# Patient Record
Sex: Female | Born: 1980 | Race: Black or African American | Hispanic: No | Marital: Married | Smoking: Never smoker
Health system: Southern US, Community
[De-identification: ages and names within clinical notes are randomized; demographics above are authoritative.]

## PROBLEM LIST (undated history)

## (undated) DIAGNOSIS — I10 Essential (primary) hypertension: Secondary | ICD-10-CM

---

## 2008-03-10 ENCOUNTER — Emergency Department (HOSPITAL_COMMUNITY): Admission: EM | Admit: 2008-03-10 | Discharge: 2008-03-10 | Payer: Self-pay | Admitting: Family Medicine

## 2008-09-07 ENCOUNTER — Emergency Department (HOSPITAL_COMMUNITY): Admission: EM | Admit: 2008-09-07 | Discharge: 2008-09-07 | Payer: Self-pay | Admitting: Family Medicine

## 2010-01-14 ENCOUNTER — Emergency Department (HOSPITAL_COMMUNITY): Admission: EM | Admit: 2010-01-14 | Discharge: 2010-01-14 | Payer: Self-pay | Admitting: Emergency Medicine

## 2010-01-17 ENCOUNTER — Ambulatory Visit (HOSPITAL_COMMUNITY): Admission: RE | Admit: 2010-01-17 | Discharge: 2010-01-17 | Payer: Self-pay | Admitting: Obstetrics and Gynecology

## 2011-03-01 LAB — DIFFERENTIAL
Basophils Absolute: 0 10*3/uL (ref 0.0–0.1)
Basophils Relative: 0 % (ref 0–1)
Eosinophils Absolute: 0 10*3/uL (ref 0.0–0.7)
Eosinophils Relative: 0 % (ref 0–5)
Lymphs Abs: 1.9 10*3/uL (ref 0.7–4.0)
Neutro Abs: 10.7 10*3/uL — ABNORMAL HIGH (ref 1.7–7.7)
Neutrophils Relative %: 81 % — ABNORMAL HIGH (ref 43–77)

## 2011-03-01 LAB — BASIC METABOLIC PANEL
BUN: 13 mg/dL (ref 6–23)
Calcium: 8.7 mg/dL (ref 8.4–10.5)
Chloride: 107 mEq/L (ref 96–112)
Creatinine, Ser: 0.71 mg/dL (ref 0.4–1.2)
Glucose, Bld: 118 mg/dL — ABNORMAL HIGH (ref 70–99)
Potassium: 4.1 mEq/L (ref 3.5–5.1)
Sodium: 137 mEq/L (ref 135–145)

## 2011-03-01 LAB — CBC
HCT: 30 % — ABNORMAL LOW (ref 36.0–46.0)
Hemoglobin: 9.6 g/dL — ABNORMAL LOW (ref 12.0–15.0)
Hemoglobin: 7.8 g/dL — ABNORMAL LOW (ref 12.0–15.0)
MCHC: 31.9 g/dL (ref 30.0–36.0)
MCV: 80 fL (ref 78.0–100.0)
Platelets: 275 10*3/uL (ref 150–400)
RBC: 3.75 MIL/uL — ABNORMAL LOW (ref 3.87–5.11)
RBC: 3.03 MIL/uL — ABNORMAL LOW (ref 3.87–5.11)
RDW: 17.9 % — ABNORMAL HIGH (ref 11.5–15.5)
WBC: 13.3 10*3/uL — ABNORMAL HIGH (ref 4.0–10.5)
WBC: 13.3 10*3/uL — ABNORMAL HIGH (ref 4.0–10.5)

## 2011-03-01 LAB — COMPREHENSIVE METABOLIC PANEL
ALT: 18 U/L (ref 0–35)
AST: 22 U/L (ref 0–37)
Albumin: 3.4 g/dL — ABNORMAL LOW (ref 3.5–5.2)
Alkaline Phosphatase: 63 U/L (ref 39–117)
BUN: 11 mg/dL (ref 6–23)
Creatinine, Ser: 0.62 mg/dL (ref 0.4–1.2)
GFR calc Af Amer: >60 mL/min (ref >60)
GFR calc non Af Amer: >60 mL/min (ref >60)
Sodium: 138 mEq/L (ref 135–145)
Total Bilirubin: 0.6 mg/dL (ref 0.3–1.2)
Total Protein: 6.9 g/dL (ref 6.0–8.3)

## 2011-03-01 LAB — URINALYSIS, ROUTINE W REFLEX MICROSCOPIC
Glucose, UA: NEGATIVE mg/dL
Ketones, ur: NEGATIVE mg/dL
Nitrite: NEGATIVE
Urobilinogen, UA: 0.2 mg/dL (ref 0.0–1.0)

## 2011-03-01 LAB — HCG, SERUM, QUALITATIVE: Preg, Serum: NEGATIVE

## 2011-09-04 LAB — POCT URINALYSIS DIP (DEVICE)
Ketones, ur: NEGATIVE
Nitrite: NEGATIVE
Urobilinogen, UA: 0.2

## 2011-09-04 LAB — POCT PREGNANCY, URINE: Preg Test, Ur: NEGATIVE

## 2011-09-10 LAB — POCT URINALYSIS DIP (DEVICE)
Glucose, UA: NEGATIVE
Operator id: 29721
Urobilinogen, UA: 1

## 2013-11-26 ENCOUNTER — Other Ambulatory Visit: Payer: Self-pay | Admitting: Nurse Practitioner

## 2013-11-26 ENCOUNTER — Other Ambulatory Visit (HOSPITAL_COMMUNITY)
Admission: RE | Admit: 2013-11-26 | Discharge: 2013-11-26 | Disposition: A | Discharge status: Home / Self Care | Payer: 59 | Source: Ambulatory Visit | Attending: Nurse Practitioner | Admitting: Nurse Practitioner

## 2013-11-26 DIAGNOSIS — Z01419 Encounter for gynecological examination (general) (routine) without abnormal findings: Secondary | ICD-10-CM | POA: Insufficient documentation

## 2013-11-26 DIAGNOSIS — Z1151 Encounter for screening for human papillomavirus (HPV): Secondary | ICD-10-CM | POA: Insufficient documentation

## 2013-11-26 DIAGNOSIS — Z113 Encounter for screening for infections with a predominantly sexual mode of transmission: Secondary | ICD-10-CM | POA: Insufficient documentation

## 2017-05-21 ENCOUNTER — Emergency Department (HOSPITAL_COMMUNITY)
Admission: EM | Admit: 2017-05-21 | Discharge: 2017-05-22 | Disposition: A | Discharge status: Home / Self Care | Payer: Self-pay | Attending: Emergency Medicine | Admitting: Emergency Medicine

## 2017-05-21 ENCOUNTER — Emergency Department (HOSPITAL_COMMUNITY): Payer: Self-pay

## 2017-05-21 ENCOUNTER — Encounter (HOSPITAL_COMMUNITY): Payer: Self-pay | Admitting: Emergency Medicine

## 2017-05-21 DIAGNOSIS — R1012 Left upper quadrant pain: Secondary | ICD-10-CM | POA: Insufficient documentation

## 2017-05-21 DIAGNOSIS — R197 Diarrhea, unspecified: Secondary | ICD-10-CM | POA: Insufficient documentation

## 2017-05-21 LAB — CBC
HCT: 35.9 % — ABNORMAL LOW (ref 36.0–46.0)
Hemoglobin: 11.2 g/dL — ABNORMAL LOW (ref 12.0–15.0)
MCH: 24.2 pg — AB (ref 26.0–34.0)
MCHC: 31.2 g/dL (ref 30.0–36.0)
MCV: 77.7 fL — AB (ref 78.0–100.0)
PLATELETS: 310 10*3/uL (ref 150–400)
RBC: 4.62 MIL/uL (ref 3.87–5.11)
RDW: 17.2 % — AB (ref 11.5–15.5)
WBC: 15.8 10*3/uL — ABNORMAL HIGH (ref 4.0–10.5)

## 2017-05-21 LAB — COMPREHENSIVE METABOLIC PANEL
ALBUMIN: 3.8 g/dL (ref 3.5–5.0)
ALK PHOS: 77 U/L (ref 38–126)
ALT: 16 U/L (ref 14–54)
AST: 18 U/L (ref 15–41)
Anion gap: 6 (ref 5–15)
BUN: 10 mg/dL (ref 6–20)
CALCIUM: 8.9 mg/dL (ref 8.9–10.3)
CHLORIDE: 107 mmol/L (ref 101–111)
CO2: 28 mmol/L (ref 22–32)
CREATININE: 0.66 mg/dL (ref 0.44–1.00)
GFR calc non Af Amer: >60 mL/min (ref >60)
GLUCOSE: 96 mg/dL (ref 65–99)
Potassium: 3.5 mmol/L (ref 3.5–5.1)
SODIUM: 141 mmol/L (ref 135–145)
Total Bilirubin: 0.3 mg/dL (ref 0.3–1.2)
Total Protein: 7.8 g/dL (ref 6.5–8.1)

## 2017-05-21 LAB — URINALYSIS, ROUTINE W REFLEX MICROSCOPIC
BILIRUBIN URINE: NEGATIVE
GLUCOSE, UA: NEGATIVE mg/dL
HGB URINE DIPSTICK: NEGATIVE
Ketones, ur: NEGATIVE mg/dL
Leukocytes, UA: NEGATIVE
Nitrite: NEGATIVE
PROTEIN: NEGATIVE mg/dL
Specific Gravity, Urine: 1.024 (ref 1.005–1.030)
pH: 5 (ref 5.0–8.0)

## 2017-05-21 LAB — I-STAT BETA HCG BLOOD, ED (MC, WL, AP ONLY)

## 2017-05-21 LAB — LIPASE, BLOOD: Lipase: 23 U/L (ref 11–51)

## 2017-05-21 MED ORDER — GI COCKTAIL ~~LOC~~
30.0000 mL | Freq: Once | ORAL | Status: AC
  Administered 2017-05-21: 30 mL via ORAL
  Filled 2017-05-21: qty 30

## 2017-05-21 NOTE — ED Provider Notes (Signed)
[facility]-EMERGENCY DEPT Provider Note   CSN: [id number] Arrival date & time: 05/21/17  2136     History   Chief Complaint Chief Complaint  Patient presents with  . Abdominal Pain    HPI Joy [name] is a 36 y.o. female.  Patient presents to the ED with a chief complaint of abdominal pain and diarrhea.  She states that she has been having the symptoms for the past 2 weeks.  She denies any recent fever, foreign travel, antibiotic use, or bloody stools.  She reports having about 2-3 episodes of loose stools per day and left sided abdominal pain.  She has not taken anything for her symptoms.  She denies any urinary or vaginal complaints.  She states that she is also concerned because she had a questionable exposure to TB.  She denies fever, chills, cough, night sweats.    The history is provided by the patient. No language interpreter was used.    History reviewed. No pertinent past medical history.  There are no active problems to display for this patient.   History reviewed. No pertinent surgical history.  OB History    No data available       Home Medications    Prior to Admission medications   Not on File    Family History No family history on file.  Social History Social History  Substance Use Topics  . Smoking status: Not on file  . Smokeless tobacco: Not on file  . Alcohol use Not on file     Allergies   Patient has no known allergies.   Review of Systems Review of Systems  All other systems reviewed and are negative.    Physical Exam Updated Vital Signs BP (!) 174/87 (BP Location: Right Arm)   Pulse 74   Temp 97.4 F (36.3 C) (Oral)   Resp 18   LMP 03/21/2017 Comment: neg preg test  SpO2 100%   Physical Exam  Constitutional: She is oriented to person, place, and time. She appears well-developed and well-nourished.  HENT:  Head: Normocephalic and atraumatic.  Eyes: Conjunctivae and EOM are normal. Pupils are equal, round, and  reactive to light.  Neck: Normal range of motion. Neck supple.  Cardiovascular: Normal rate and regular rhythm.  Exam reveals no gallop and no friction rub.   No murmur heard. Pulmonary/Chest: Effort normal and breath sounds normal. No respiratory distress. She has no wheezes. She has no rales. She exhibits no tenderness.  Abdominal: Soft. Bowel sounds are normal. She exhibits no distension and no mass. There is no tenderness. There is no rebound and no guarding.  No focal abdominal tenderness, no RLQ tenderness or pain at McBurney's point, no RUQ tenderness or Murphy's sign, no left-sided abdominal tenderness, no fluid wave, or signs of peritonitis   Musculoskeletal: Normal range of motion. She exhibits no edema or tenderness.  Neurological: She is alert and oriented to person, place, and time.  Skin: Skin is warm and dry.  Psychiatric: She has a normal mood and affect. Her behavior is normal. Judgment and thought content normal.  Nursing note and vitals reviewed.    ED Treatments / Results  Labs (all labs ordered are listed, but only abnormal results are displayed) Labs Reviewed  CBC - Abnormal; Notable for the following:       Result Value   WBC 15.8 (*)    Hemoglobin 11.2 (*)    HCT 35.9 (*)    MCV 77.7 (*)    MCH 24.2 (*)  RDW 17.2 (*)    All other components within normal limits  LIPASE, BLOOD  COMPREHENSIVE METABOLIC PANEL  URINALYSIS, ROUTINE W REFLEX MICROSCOPIC  I-STAT BETA HCG BLOOD, ED ([facility], [facility], [facility] ONLY)    EKG  EKG Interpretation None       Radiology No results found.  Procedures Procedures (including critical care time)  Medications Ordered in ED Medications  gi cocktail (Maalox,Lidocaine,Donnatal) (not administered)     Initial Impression / Assessment and Plan / ED Course  I have reviewed the triage vital signs and the nursing notes.  Pertinent labs & imaging results that were available during my care of the patient were reviewed by me and  considered in my medical decision making (see chart for details).     Patient with 2 weeks of left sided abdominal pain and some diarrhea.  No fever or blood in stool.  No foreign travel or recent abx.  Given length of symptoms, she may benefit from some cipro.  I am also concerned that she may some PUD contributing to her symptoms.  She describes a burning sensation in her LUQ, which is gnawing and intermittent.  Will give GI cocktail.  She requests CXR based on TB exposure.  She is being worked up by the health department for this.  She has no acute symptoms and is well appearing.    CXR is clear.  Will treat diarrhea given length of symptoms and will treat for PUD.  Final Clinical Impressions(s) / ED Diagnoses   Final diagnoses:  Diarrhea, unspecified type  Left upper quadrant pain    New Prescriptions New Prescriptions   CIPROFLOXACIN (CIPRO) 500 MG TABLET    Take 1 tablet (500 mg total) by mouth 2 (two) times daily.   OMEPRAZOLE (PRILOSEC) 20 MG CAPSULE    Take 1 capsule (20 mg total) by mouth daily.   SUCRALFATE (CARAFATE) 1 GM/10ML SUSPENSION    Take 10 mLs (1 g total) by mouth 4 (four) times daily -  with meals and at bedtime.     Roxy Horseman, PA-C 05/22/17 1610    Pricilla Loveless, MD 05/22/17 [phone]

## 2017-05-21 NOTE — ED Triage Notes (Signed)
Patient c/o lower abdominal pain with diarrhea x2 weeks. Denies emesis and urinary sx.

## 2017-05-22 MED ORDER — SUCRALFATE 1 GM/10ML PO SUSP: 1.0000 g | Freq: Three times a day (TID) | ORAL | 0 refills | Status: DC

## 2017-05-22 MED ORDER — OMEPRAZOLE 20 MG PO CPDR: 20.0000 mg | DELAYED_RELEASE_CAPSULE | Freq: Every day | ORAL | 0 refills | Status: DC

## 2017-05-22 MED ORDER — CIPROFLOXACIN HCL 500 MG PO TABS: 500.0000 mg | ORAL_TABLET | Freq: Two times a day (BID) | ORAL | 0 refills | Status: DC

## 2017-05-24 ENCOUNTER — Other Ambulatory Visit: Payer: Self-pay | Admitting: Infectious Disease

## 2018-12-07 IMAGING — CR DG ABDOMEN ACUTE W/ 1V CHEST
4 series · 4 of 4 positions shown · non-contrast
Comparison: None.

CLINICAL DATA: Generalized abdominal pain and diarrhea for 2 weeks

EXAM:
DG ABDOMEN ACUTE W/ 1V CHEST

[w chest pa]
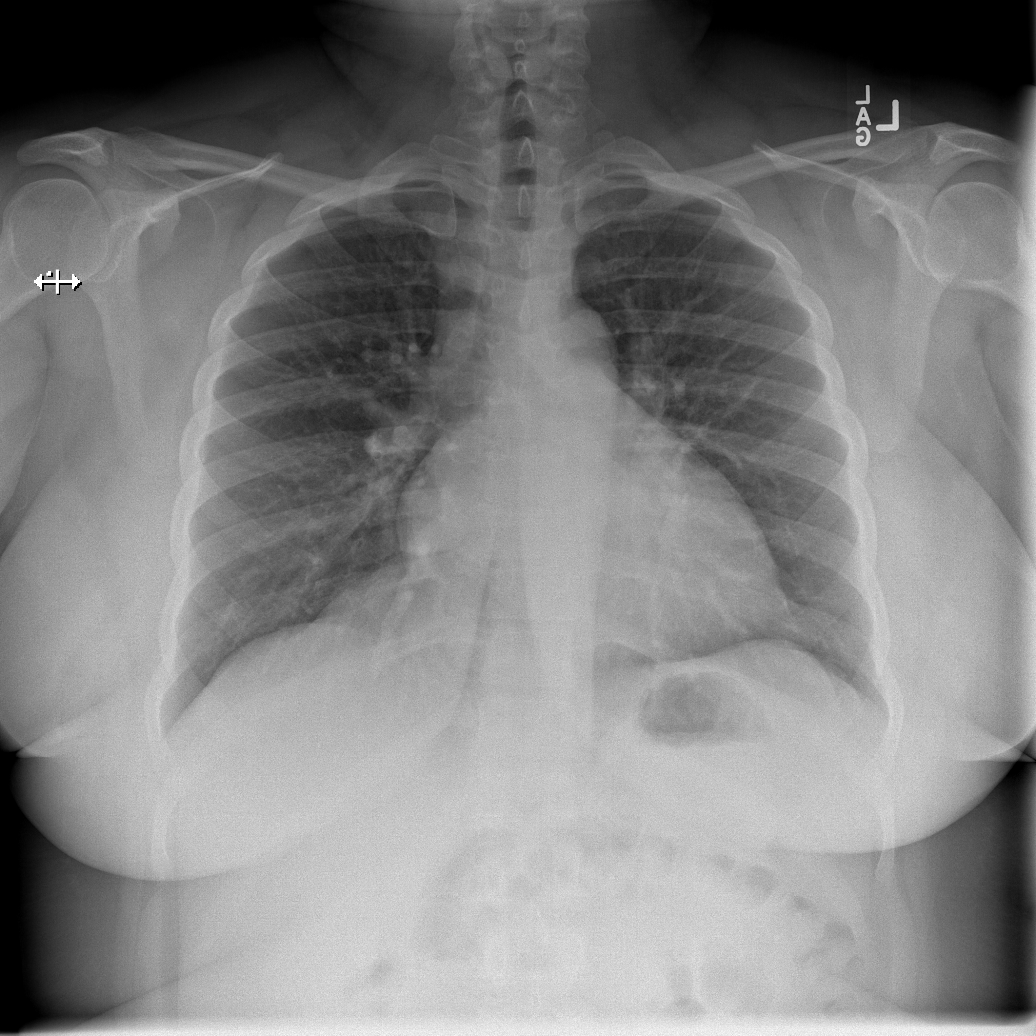

[w abdomen upright]
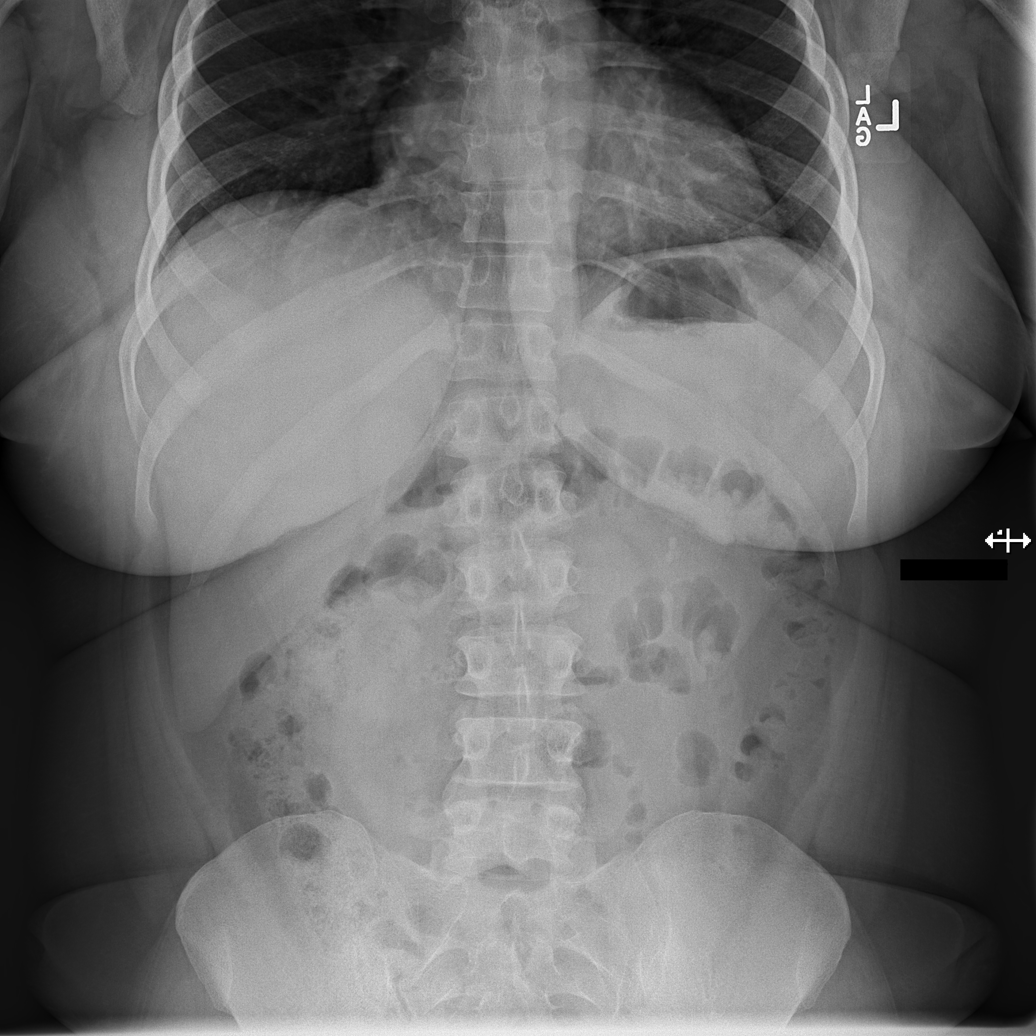

[t abdomen supine (1 of 2)]
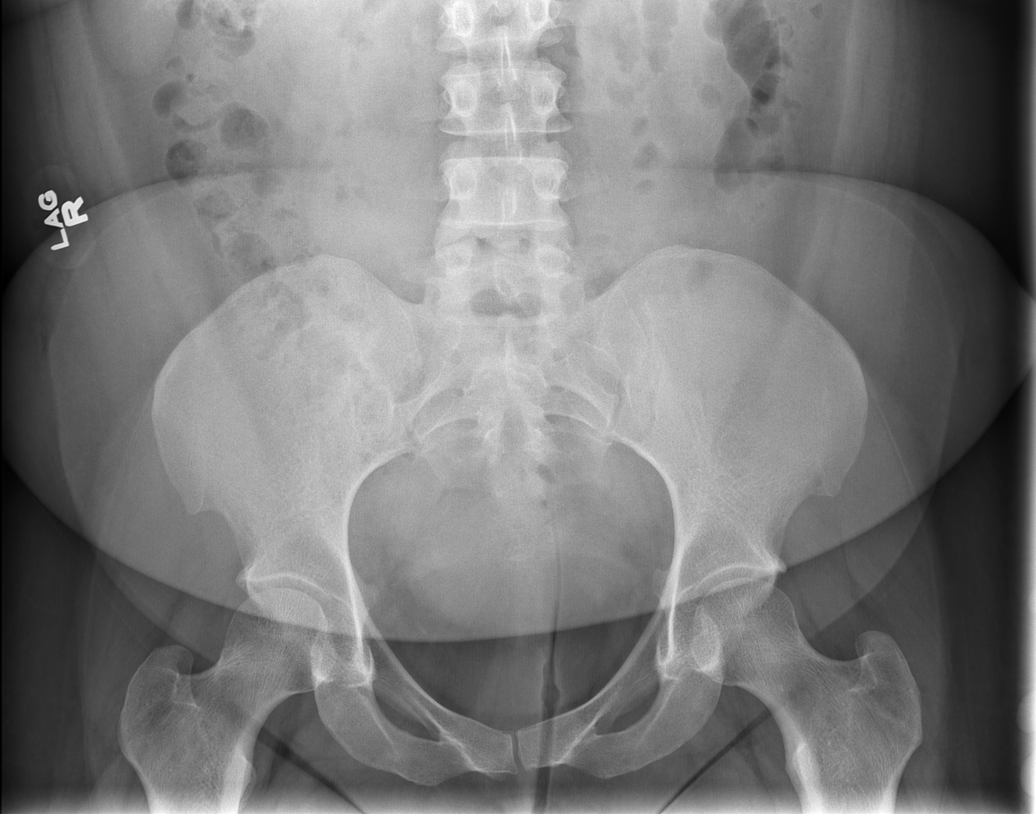

[t abdomen supine (2 of 2)]
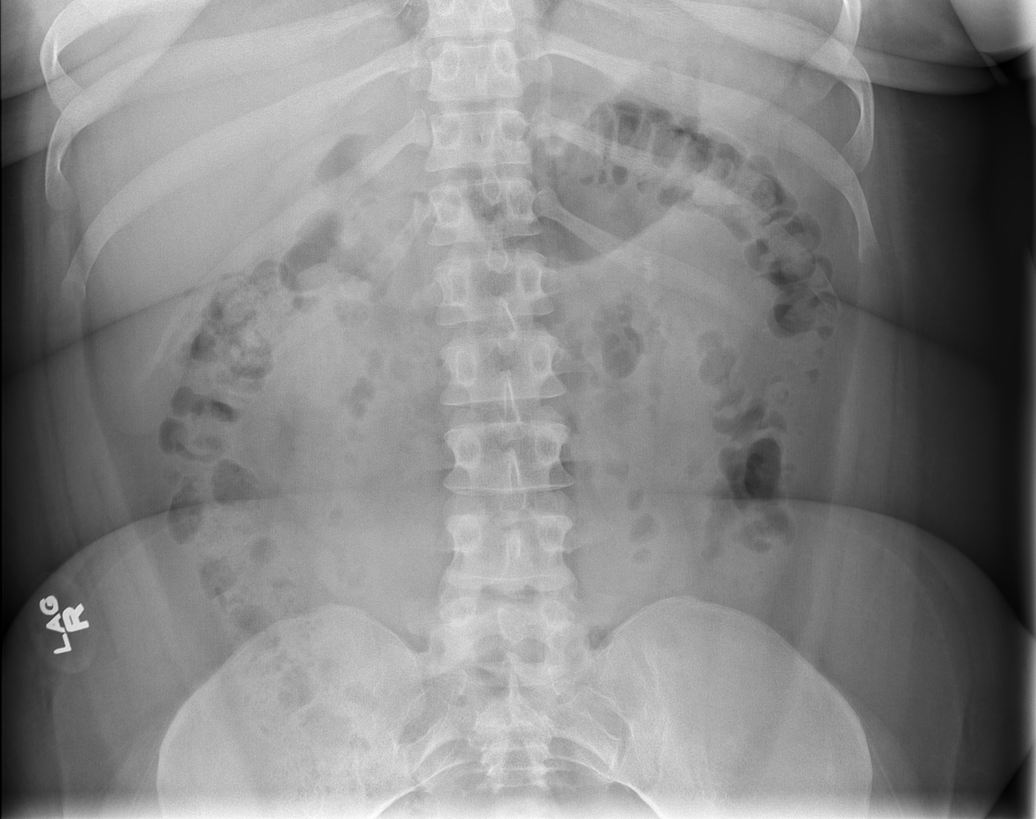

[4 of 4 positions shown; findings below may reference images not displayed]

FINDINGS: There is no evidence of dilated bowel loops or free intraperitoneal
air. No radiopaque calculi or other significant radiographic
abnormality is seen. Heart size and mediastinal contours are within
normal limits. Both lungs are clear.
IMPRESSION: Negative abdominal radiographs.  No acute cardiopulmonary disease.

## 2019-02-05 ENCOUNTER — Encounter: Payer: Self-pay | Admitting: Hematology

## 2019-03-16 ENCOUNTER — Encounter: Payer: Self-pay | Admitting: Hematology

## 2019-03-16 ENCOUNTER — Telehealth: Payer: Self-pay | Admitting: Hematology

## 2019-03-16 NOTE — Telephone Encounter (Signed)
A new hem appt has been scheduled for the pt to see Dr. Candise Che on 4/15 at 1pm. Pt aware to arrive 115 minutes early.

## 2019-03-24 NOTE — Progress Notes (Signed)
HEMATOLOGY/ONCOLOGY CONSULTATION NOTE  Date of Service: 03/25/2019  Patient Care Team: Patient, No Pcp Per as PCP - General (General Practice)  CHIEF COMPLAINTS/PURPOSE OF CONSULTATION:  Anemia and Neutrophilia  HISTORY OF PRESENTING ILLNESS:   Joy Joy [name] is a wonderful 38 y.o. female who has been referred to [place] by Malachy Chamber, FNP for evaluation and management of Anemia and Neutrophilia. Joy Joy [name] reports that she is doing well overall.   Joy Joy [name] reports that she has been aware of her anemia for "at least 7-8 years." She passed out several years ago due to anemia, which she notes was related to a month long period. She notes that her periods have always been heavy, and she endorses polycystic ovary syndrome. She notes that her periods have been irregular in Joy past, and periods usually last 7-8 days. Joy Joy [name] notes that she has twin girls, who are 39 years old. She has been advised to take PO birth control by OBGYN, but has not wanted to do this. She notes that she last took PO iron replacement a while ago, but now takes a daily multivitamin with iron for Joy last few months.   Joy Joy [name] notes that she has been pretty tired for a while. However, she notes that her fatigue does not keep her from completing day to day activities. She notes that she has ice cravings and endorses cheese cravings. She notes that she stopped eating meat a year ago, which she describes as "a personal choice."  She denies any other medical concerns. She had a D&C about 8 years ago, denies other surgeries. She endorses seasonal allergies. She denies smoking cigarettes, and denies consuming alcohol. She denies a concern for thyroid abnormalities.  Most recent lab results (01/12/19) of CBC w/diff and CMP is as follows: all values are WNL except for WBC at 16k, HGB at 8.9, HCT at 30.3, MCV at 66, MCH at 19.5, MCHC at 29.4, RDW at 18.1, ANC at 11.2k, Lymphs abs at 3.8k, Sodium at 145, CO2 at 19.  On review of systems,  Joy [name] reports heavy periods, feeling tired a lot, ice cravings, and denies abdominal pains, leg swelling, dizziness, and any other symptoms.   On PMHx Joy Joy [name] reports menorrhagia, PCOS, iron deficiency anemia. Denies thyroid problems. 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 Denies smoking cigarettes or consuming alcohol. On Family Hx Joy Joy [name] reports maternal grandmother with cancer, maternal uncles with cancer; Joy [name] not sure what kinds of cancer. Denies blood disorders.  MEDICAL HISTORY:  menorrhagia, PCOS, iron deficiency anemia.  SURGICAL HISTORY: No past surgical history on file.  SOCIAL HISTORY: Social History   Socioeconomic History  . Marital status: Married    Spouse name: Not on file  . Number of children: Not on file  . Years of education: Not on file  . Highest education level: Not on file  Occupational History  . Not on file  Social Needs  . Financial resource strain: Not on file  . Food insecurity:    Worry: Not on file    Inability: Not on file  . Transportation needs:    Medical: Not on file    Non-medical: Not on file  Tobacco Use  . Smoking status: Not on file  Substance and Sexual Activity  . Alcohol use: Not on file  . Drug use: Not on file  . Sexual activity: Not on file  Lifestyle  . Physical activity:    Days per week: Not  on file    Minutes per session: Not on file  . Stress: Not on file  Relationships  . Social connections:    Talks on phone: Not on file    Gets together: Not on file    Attends religious service: Not on file    Active member of club or organization: Not on file    Attends meetings of clubs or organizations: Not on file    Relationship status: Not on file  . Intimate partner violence:    Fear of current or ex partner: Not on file    Emotionally abused: Not on file    Physically abused: Not on file    Forced sexual activity: Not on file  Other Topics Concern  . Not on file  Social History Narrative  . Not on file     FAMILY HISTORY: No family history on file.  ALLERGIES:  has No Known Allergies.  MEDICATIONS:  Current Outpatient Medications  Medication Sig Dispense Refill  . iron polysaccharides (NIFEREX) 150 MG capsule Take 1 capsule (150 mg total) by mouth 2 (two) times daily. 60 capsule 3   No current facility-administered medications for this visit.     REVIEW OF SYSTEMS:    10 Point review of Systems was done is negative except as noted above.  PHYSICAL EXAMINATION:   . Vitals:   03/25/19 1244  BP: 137/86  Pulse: 88  Resp: 18  Temp: 97.9 F (36.6 C)  SpO2: 100%   Filed Weights   03/25/19 1244  Weight: 217 lb 9.6 oz (98.7 kg)   .Body mass index is 39.8 kg/m.  GENERAL:alert, in no acute distress and comfortable SKIN: no acute rashes, no significant lesions EYES: conjunctiva are pink and non-injected, sclera anicteric OROPHARYNX: MMM, no exudates, no oropharyngeal erythema or ulceration NECK: supple, no JVD LYMPH: no palpable lymphadenopathy in Joy cervical, axillary or inguinal regions LUNGS: clear to auscultation b/l with normal respiratory effort HEART: regular rate & rhythm ABDOMEN:  normoactive bowel sounds , non tender, not distended. No palpable hepatosplenomegaly. Extremity: no pedal edema PSYCH: alert & oriented x 3 with fluent speech NEURO: no focal motor/sensory deficits  LABORATORY DATA:  I have reviewed Joy data as listed  . CBC Latest Ref Rng & Units 03/25/2019 05/21/2017 01/17/2010  WBC 4.0 - 10.5 K/uL 13.2(H) 15.8(H) 13.3(H)  Hemoglobin 12.0 - 15.0 g/dL 6.0(A9.8(L) 11.2(L) 7.8(L)  Hematocrit 36.0 - 46.0 % 34.3(L) 35.9(L) 24.3(L)  Platelets 150 - 400 K/uL 444(H) 310 275    . CMP Latest Ref Rng & Units 03/25/2019 05/21/2017 01/17/2010  Glucose 70 - 99 mg/dL 90 96 95  BUN 6 - 20 mg/dL 8 10 11   Creatinine 0.44 - 1.00 mg/dL 5.400.75 9.810.66 1.910.62  Sodium 135 - 145 mmol/L 139 141 138  Potassium 3.5 - 5.1 mmol/L 3.7 3.5 3.5  Chloride 98 - 111 mmol/L 103 107 104  CO2  22 - 32 mmol/L 25 28 27   Calcium 8.9 - 10.3 mg/dL 9.0 8.9 8.6  Total Protein 6.5 - 8.1 g/dL 7.9 7.8 6.9  Total Bilirubin 0.3 - 1.2 mg/dL 0.3 0.3 0.6  Alkaline Phos 38 - 126 U/L 82 77 63  AST 15 - 41 U/L 15 18 22   ALT 0 - 44 U/L 12 16 18     01/12/19 CBC w/diff and CMP:     RADIOGRAPHIC STUDIES: I have personally reviewed Joy radiological images as listed and agreed with Joy findings in Joy report. No results found.  ASSESSMENT & PLAN:  39 y.o. female with  1. Anemia- Iron deficiency anemia - likely related to heavy menstrual bleeding. 2. Neutrophilia - likely reactive and related to patients PCOS  PLAN: -Discussed patient's most recent labs from 01/12/19, WBC at 16.0k with ANC at 11.2k, HGB at 8.9 with RBC at 4.57 and MCV at 66. PLT borderline high at 406k. -Reviewed previous labs: 05/21/17 HGB at 11.2, 01/17/10 HGB at 7.8. -Leukocytosis seems to be chronic, going back at least to 2011 if not longer. WBC at 15.8k in June 2018, and WBC at 13.3k in February 2011.  -No overt progression seen over Joy last 9 years, reassuring against a primary bone marrow problem/MPN -Discussed that stable neutrophilia tends to represent chronic inflammation which can be associated with PCOS and weight gain -Recommend optimizing ideal body weight, staying active and eating well -Discussed that Joy patient's menorrhagia is most likely driving her iron deficiency anemia -Recommend beginning PO 150mg  Iron Polysaccharide, and if Joy [name] does not tolerate this or HGB does not improve adequately, would then consider IV Iron replacement. -Recommend following up with Malachy Chamber, FNP as PCP and OBGYN to address and manage her menorrhagia as this will have a bearing on her anemia and iron requirements -Will order labs today -Will see Joy Joy [name] back in 2 months   RTC with Dr Candise Che with labs in 2 months  . Orders Placed This Encounter  Procedures  . CBC with Differential/Platelet    Standing Status:   Future    Number  of Occurrences:   1    Standing Expiration Date:   04/28/2020  . CMP (Cancer Center only)    Standing Status:   Future    Number of Occurrences:   1    Standing Expiration Date:   03/24/2020  . Sedimentation rate    Standing Status:   Future    Number of Occurrences:   1    Standing Expiration Date:   03/24/2020  . Ferritin    Standing Status:   Future    Number of Occurrences:   1    Standing Expiration Date:   03/24/2020  . Iron and TIBC    Standing Status:   Future    Number of Occurrences:   1    Standing Expiration Date:   03/24/2020  . Vitamin B12    Standing Status:   Future    Number of Occurrences:   1    Standing Expiration Date:   03/24/2020  . CBC with Differential/Platelet    Standing Status:   Future    Standing Expiration Date:   04/28/2020  . Ferritin    Standing Status:   Future    Standing Expiration Date:   03/24/2020     All of Joy patients questions were answered with apparent satisfaction. Joy patient knows to call Joy clinic with any problems, questions or concerns.  Joy total time spent in Joy appt was 45 minutes and more than 50% was on counseling and direct patient cares.    Wyvonnia Lora MD MS AAHIVMS [facility] Hematology/Oncology Physician [facility]  (Office):       [phone] (Work cell):  [phone] (Fax):           [phone]  03/25/2019 1:34 PM  I, Marcelline Mates, am acting as a scribe for Dr. Wyvonnia Lora.   .I have reviewed Joy above documentation for accuracy and completeness, and I agree with Joy above. Johney Maine MD

## 2019-03-25 ENCOUNTER — Telehealth: Payer: Self-pay | Admitting: Hematology

## 2019-03-25 ENCOUNTER — Inpatient Hospital Stay: Payer: BC Managed Care – PPO | Attending: Hematology | Admitting: Hematology

## 2019-03-25 ENCOUNTER — Inpatient Hospital Stay: Payer: BC Managed Care – PPO

## 2019-03-25 ENCOUNTER — Other Ambulatory Visit: Payer: Self-pay

## 2019-03-25 VITALS — BP 137/86 | HR 88 | Temp 97.9°F | Resp 18 | Ht 62.0 in | Wt 217.6 lb

## 2019-03-25 DIAGNOSIS — D729 Disorder of white blood cells, unspecified: Secondary | ICD-10-CM

## 2019-03-25 DIAGNOSIS — D500 Iron deficiency anemia secondary to blood loss (chronic): Secondary | ICD-10-CM

## 2019-03-25 DIAGNOSIS — N920 Excessive and frequent menstruation with regular cycle: Secondary | ICD-10-CM | POA: Diagnosis not present

## 2019-03-25 DIAGNOSIS — D509 Iron deficiency anemia, unspecified: Secondary | ICD-10-CM

## 2019-03-25 LAB — CMP (CANCER CENTER ONLY)
ALT: 12 U/L (ref 0–44)
AST: 15 U/L (ref 15–41)
Albumin: 3.6 g/dL (ref 3.5–5.0)
Alkaline Phosphatase: 82 U/L (ref 38–126)
Anion gap: 11 (ref 5–15)
BUN: 8 mg/dL (ref 6–20)
CO2: 25 mmol/L (ref 22–32)
Calcium: 9 mg/dL (ref 8.9–10.3)
Chloride: 103 mmol/L (ref 98–111)
Creatinine: 0.75 mg/dL (ref 0.44–1.00)
GFR, Est AFR Am: >60 mL/min (ref >60)
GFR, Estimated: >60 mL/min (ref >60)
Glucose, Bld: 90 mg/dL (ref 70–99)
Potassium: 3.7 mmol/L (ref 3.5–5.1)
Sodium: 139 mmol/L (ref 135–145)
Total Bilirubin: 0.3 mg/dL (ref 0.3–1.2)
Total Protein: 7.9 g/dL (ref 6.5–8.1)

## 2019-03-25 LAB — CBC WITH DIFFERENTIAL/PLATELET
Abs Immature Granulocytes: 0.06 10*3/uL (ref 0.00–0.07)
Basophils Absolute: 0 10*3/uL (ref 0.0–0.1)
Basophils Relative: 0 %
Eosinophils Absolute: 0.2 10*3/uL (ref 0.0–0.5)
Eosinophils Relative: 1 %
HCT: 34.3 % — ABNORMAL LOW (ref 36.0–46.0)
Hemoglobin: 9.8 g/dL — ABNORMAL LOW (ref 12.0–15.0)
Immature Granulocytes: 1 %
Lymphocytes Relative: 30 %
Lymphs Abs: 4 10*3/uL (ref 0.7–4.0)
MCH: 19.6 pg — ABNORMAL LOW (ref 26.0–34.0)
MCHC: 28.6 g/dL — ABNORMAL LOW (ref 30.0–36.0)
MCV: 68.7 fL — ABNORMAL LOW (ref 80.0–100.0)
Monocytes Absolute: 0.6 10*3/uL (ref 0.1–1.0)
Monocytes Relative: 5 %
Neutro Abs: 8.3 10*3/uL — ABNORMAL HIGH (ref 1.7–7.7)
Neutrophils Relative %: 63 %
Platelets: 444 10*3/uL — ABNORMAL HIGH (ref 150–400)
RBC: 4.99 MIL/uL (ref 3.87–5.11)
RDW: 19.9 % — ABNORMAL HIGH (ref 11.5–15.5)
WBC: 13.2 10*3/uL — ABNORMAL HIGH (ref 4.0–10.5)
nRBC: 0 % (ref 0.0–0.2)

## 2019-03-25 LAB — IRON AND TIBC
Iron: 15 ug/dL — ABNORMAL LOW (ref 41–142)
Saturation Ratios: 4 % — ABNORMAL LOW (ref 21–57)
TIBC: 389 ug/dL (ref 236–444)
UIBC: 373 ug/dL (ref 120–384)

## 2019-03-25 LAB — SEDIMENTATION RATE: Sed Rate: 26 mm/hr — ABNORMAL HIGH (ref 0–22)

## 2019-03-25 LAB — VITAMIN B12: Vitamin B-12: 354 pg/mL (ref 180–914)

## 2019-03-25 LAB — FERRITIN: Ferritin: 7 ng/mL — ABNORMAL LOW (ref 11–307)

## 2019-03-25 MED ORDER — POLYSACCHARIDE IRON COMPLEX 150 MG PO CAPS: 150.0000 mg | ORAL_CAPSULE | Freq: Two times a day (BID) | ORAL | 3 refills | Status: AC

## 2019-03-25 NOTE — Telephone Encounter (Signed)
Scheduled appt per 4/15 los. °

## 2019-05-25 NOTE — Progress Notes (Signed)
HEMATOLOGY/ONCOLOGY CONSULTATION NOTE  Date of Service: 05/26/2019  Patient Care Team: Patient, No Pcp Per as PCP - General (General Practice)  CHIEF COMPLAINTS/PURPOSE OF CONSULTATION:  Anemia and Neutrophilia  HISTORY OF PRESENTING ILLNESS:   Joy [name] is a wonderful 38 y.o. female who has been referred to us by Malachy Chamberakia Starkes, FNP for evaluation and management of Anemia and Neutrophilia. The pt reports that she is doing well overall.   The pt reports that she has been aware of her anemia for "at least 7-8 years." She passed out several years ago due to anemia, which she notes was related to a month long period. She notes that her periods have always been heavy, and she endorses polycystic ovary syndrome. She notes that her periods have been irregular in the past, and periods usually last 7-8 days. The pt notes that she has twin girls, who are 39 years old. She has been advised to take PO birth control by OBGYN, but has not wanted to do this. She notes that she last took PO iron replacement a while ago, but now takes a daily multivitamin with iron for the last few months.   The pt notes that she has been pretty tired for a while. However, she notes that her fatigue does not keep her from completing day to day activities. She notes that she has ice cravings and endorses cheese cravings. She notes that she stopped eating meat a year ago, which she describes as "a personal choice."  She denies any other medical concerns. She had a D&C about 8 years ago, denies other surgeries. She endorses seasonal allergies. She denies smoking cigarettes, and denies consuming alcohol. She denies a concern for thyroid abnormalities.  Most recent lab results (01/12/19) of CBC w/diff and CMP is as follows: all values are WNL except for WBC at 16k, HGB at 8.9, HCT at 30.3, MCV at 66, MCH at 19.5, MCHC at 29.4, RDW at 18.1, ANC at 11.2k, Lymphs abs at 3.8k, Sodium at 145, CO2 at 19.  On review of systems,  pt reports heavy periods, feeling tired a lot, ice cravings, and denies abdominal pains, leg swelling, dizziness, and any other symptoms.   On PMHx the pt reports menorrhagia, PCOS, iron deficiency anemia. Denies thyroid problems. 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 Denies smoking cigarettes or consuming alcohol. On Family Hx the pt reports maternal grandmother with cancer, maternal uncles with cancer; pt not sure what kinds of cancer. Denies blood disorders.  Interval History:   Joy Harrybony M Lugar returns today for management and evaluation of her microcytic anemia. The patient's last visit with us was on 03/25/19. The pt reports that she is doing well overall.  The pt reports that she has been able to take PO 150mg  Iron Polysaccharide every day since our last visit. She notes that this has resulted in improved energy levels. The pt notes that her periods have continued to last 7 days with first 3 days being heavy. She notes that her weight has been stable. The pt adds that her ice cravings have reduced as well.  Lab results today (05/26/19) of CBC w/diff is as follows: all values are WNL except for WBC at 11.7k, RBC at 5.25, MCH at 24.6, RDW at 21.8. 05/26/19 Ferritin is pending  On review of systems, pt reports stable weight, heavy periods, improved energy levels, and denies concerns for infections, abdominal pains, leg swelling, and any other symptoms.   MEDICAL HISTORY:  menorrhagia, PCOS, iron deficiency anemia.  SURGICAL HISTORY: No past surgical history on file.  SOCIAL HISTORY: Social History   Socioeconomic History   Marital status: Married    Spouse name: Not on file   Number of children: Not on file   Years of education: Not on file   Highest education level: Not on file  Occupational History   Not on file  Social Needs   Financial resource strain: Not on file   Food insecurity    Worry: Not on file    Inability: Not on file   Transportation  needs    Medical: Not on file    Non-medical: Not on file  Tobacco Use   Smoking status: Not on file  Substance and Sexual Activity   Alcohol use: Not on file   Drug use: Not on file   Sexual activity: Not on file  Lifestyle   Physical activity    Days per week: Not on file    Minutes per session: Not on file   Stress: Not on file  Relationships   Social connections    Talks on phone: Not on file    Gets together: Not on file    Attends religious service: Not on file    Active member of club or organization: Not on file    Attends meetings of clubs or organizations: Not on file    Relationship status: Not on file   Intimate partner violence    Fear of current or ex partner: Not on file    Emotionally abused: Not on file    Physically abused: Not on file    Forced sexual activity: Not on file  Other Topics Concern   Not on file  Social History Narrative   Not on file    FAMILY HISTORY: No family history on file.  ALLERGIES:  has No Known Allergies.  MEDICATIONS:  Current Outpatient Medications  Medication Sig Dispense Refill   iron polysaccharides (NIFEREX) 150 MG capsule Take 1 capsule (150 mg total) by mouth 2 (two) times daily. 60 capsule 3   No current facility-administered medications for this visit.     REVIEW OF SYSTEMS:    A 10+ POINT REVIEW OF SYSTEMS WAS OBTAINED including neurology, dermatology, psychiatry, cardiac, respiratory, lymph, extremities, GI, GU, Musculoskeletal, constitutional, breasts, reproductive, HEENT.  All pertinent positives are noted in the HPI.  All others are negative.   PHYSICAL EXAMINATION:   Vitals:   05/26/19 1035  BP: 140/83  Pulse: 72  Resp: 18  Temp: 98.3 F (36.8 C)  SpO2: 100%   Filed Weights   05/26/19 1035  Weight: 218 lb 9.6 oz (99.2 kg)   .Body mass index is 39.98 kg/m.  GENERAL:alert, in no acute distress and comfortable SKIN: no acute rashes, no significant lesions EYES: conjunctiva are pink  and non-injected, sclera anicteric OROPHARYNX: MMM, no exudates, no oropharyngeal erythema or ulceration NECK: supple, no JVD LYMPH:  no palpable lymphadenopathy in the cervical, axillary or inguinal regions LUNGS: clear to auscultation b/l with normal respiratory effort HEART: regular rate & rhythm ABDOMEN:  normoactive bowel sounds , non tender, not distended. No palpable hepatosplenomegaly.  Extremity: no pedal edema PSYCH: alert & oriented x 3 with fluent speech NEURO: no focal motor/sensory deficits   LABORATORY DATA:  I have reviewed the data as listed  . CBC Latest Ref Rng & Units 05/26/2019 03/25/2019 05/21/2017  WBC 4.0 - 10.5 K/uL 11.7(H) 13.2(H) 15.8(H)  Hemoglobin 12.0 - 15.0 g/dL 12.9  9.8(L) 11.2(L)  Hematocrit 36.0 - 46.0 % 42.3 34.3(L) 35.9(L)  Platelets 150 - 400 K/uL 312 444(H) 310    . CMP Latest Ref Rng & Units 03/25/2019 05/21/2017 01/17/2010  Glucose 70 - 99 mg/dL 90 96 95  BUN 6 - 20 mg/dL 8 10 11   Creatinine 0.44 - 1.00 mg/dL 0.75 0.66 0.62  Sodium 135 - 145 mmol/L 139 141 138  Potassium 3.5 - 5.1 mmol/L 3.7 3.5 3.5  Chloride 98 - 111 mmol/L 103 107 104  CO2 22 - 32 mmol/L 25 28 27   Calcium 8.9 - 10.3 mg/dL 9.0 8.9 8.6  Total Protein 6.5 - 8.1 g/dL 7.9 7.8 6.9  Total Bilirubin 0.3 - 1.2 mg/dL 0.3 0.3 0.6  Alkaline Phos 38 - 126 U/L 82 77 63  AST 15 - 41 U/L 15 18 22   ALT 0 - 44 U/L 12 16 18     01/12/19 CBC w/diff and CMP:     RADIOGRAPHIC STUDIES: I have personally reviewed the radiological images as listed and agreed with the findings in the report. No results found.  ASSESSMENT & PLAN:  38 y.o. female with  1. Anemia- Iron deficiency anemia - likely related to heavy menstrual bleeding.  Labs upon initial presentation from 01/12/19, WBC at 16.0k with ANC at 11.2k, HGB at 8.9 with RBC at 4.57 and MCV at 66. PLT borderline high at 406k. Reviewed previous labs: 05/21/17 HGB at 11.2, 01/17/10 HGB at 7.8.  2. Neutrophilia - likely reactive and related to  patients PCOS Leukocytosis seems to be chronic, going back at least to 2011 if not longer. WBC at 15.8k in June 2018, and WBC at 13.3k in February 2011.  No overt progression seen over the last 9 years, reassuring against a primary bone marrow problem/MPN  PLAN: -Discussed pt labwork today, 05/26/19; HGB normalized to 12.9 with MCV of 80.6. PLT normalized to 312k. ANC normalized to 7.0k -05/26/19 Ferritin is up from 7 to 41. Goal for Ferritin >100 -Recommend continuing PO 150mg  Iron Polysaccharide daily -Discussed that stable neutrophilia tends to represent chronic inflammation which can be associated with PCOS and weight gain -Recommend optimizing ideal body weight, staying active and eating well -Recommend again following up with Priscille Loveless, FNP as PCP and OBGYN to address and manage her menorrhagia as this will have a bearing on her anemia and iron requirements -Will see the pt back in 6 months   RTC with Dr Irene Limbo with labs in 6 months   Orders Placed This Encounter  Procedures   CBC with Differential/Platelet    Standing Status:   Future    Standing Expiration Date:   06/29/2020   Ferritin    Standing Status:   Future    Standing Expiration Date:   05/25/2020     All of the patients questions were answered with apparent satisfaction. The patient knows to call the clinic with any problems, questions or concerns.  The total time spent in the appt was 15 minutes and more than 50% was on counseling and direct patient cares.    Sullivan Lone MD MS AAHIVMS [facility] Hematology/Oncology Physician [facility]  (Office):       [phone] (Work cell):  [phone] (Fax):           [phone]  05/26/2019 12:15 PM  I, Baldwin Jamaica, am acting as a scribe for Dr. Sullivan Lone.   .I have reviewed the above documentation for accuracy and completeness, and I agree with the above. Marland Kitchen  Brunetta Genera MD

## 2019-05-26 ENCOUNTER — Telehealth: Payer: Self-pay | Admitting: Hematology

## 2019-05-26 ENCOUNTER — Other Ambulatory Visit: Payer: Self-pay

## 2019-05-26 ENCOUNTER — Inpatient Hospital Stay (HOSPITAL_BASED_OUTPATIENT_CLINIC_OR_DEPARTMENT_OTHER): Payer: BC Managed Care – PPO | Admitting: Hematology

## 2019-05-26 ENCOUNTER — Inpatient Hospital Stay: Payer: BC Managed Care – PPO | Attending: Hematology

## 2019-05-26 VITALS — BP 140/83 | HR 72 | Temp 98.3°F | Resp 18 | Ht 62.0 in | Wt 218.6 lb

## 2019-05-26 DIAGNOSIS — N920 Excessive and frequent menstruation with regular cycle: Secondary | ICD-10-CM | POA: Diagnosis not present

## 2019-05-26 DIAGNOSIS — D720 Genetic anomalies of leukocytes: Secondary | ICD-10-CM | POA: Insufficient documentation

## 2019-05-26 DIAGNOSIS — D500 Iron deficiency anemia secondary to blood loss (chronic): Secondary | ICD-10-CM | POA: Diagnosis present

## 2019-05-26 DIAGNOSIS — Z79899 Other long term (current) drug therapy: Secondary | ICD-10-CM | POA: Diagnosis not present

## 2019-05-26 DIAGNOSIS — D509 Iron deficiency anemia, unspecified: Secondary | ICD-10-CM

## 2019-05-26 LAB — CBC WITH DIFFERENTIAL/PLATELET
Abs Immature Granulocytes: 0.02 10*3/uL (ref 0.00–0.07)
Basophils Absolute: 0 10*3/uL (ref 0.0–0.1)
Basophils Relative: 0 %
Eosinophils Absolute: 0.1 10*3/uL (ref 0.0–0.5)
Eosinophils Relative: 1 %
HCT: 42.3 % (ref 36.0–46.0)
Hemoglobin: 12.9 g/dL (ref 12.0–15.0)
Immature Granulocytes: 0 %
Lymphocytes Relative: 32 %
Lymphs Abs: 3.8 10*3/uL (ref 0.7–4.0)
MCH: 24.6 pg — ABNORMAL LOW (ref 26.0–34.0)
MCHC: 30.5 g/dL (ref 30.0–36.0)
MCV: 80.6 fL (ref 80.0–100.0)
Monocytes Absolute: 0.8 10*3/uL (ref 0.1–1.0)
Monocytes Relative: 7 %
Neutro Abs: 7 10*3/uL (ref 1.7–7.7)
Neutrophils Relative %: 60 %
Platelets: 312 10*3/uL (ref 150–400)
RBC: 5.25 MIL/uL — ABNORMAL HIGH (ref 3.87–5.11)
RDW: 21.8 % — ABNORMAL HIGH (ref 11.5–15.5)
WBC: 11.7 10*3/uL — ABNORMAL HIGH (ref 4.0–10.5)
nRBC: 0 % (ref 0.0–0.2)

## 2019-05-26 LAB — FERRITIN: Ferritin: 41 ng/mL (ref 11–307)

## 2019-05-26 NOTE — Telephone Encounter (Signed)
Scheduled per los. Patient declined printout but confirmed date and time

## 2019-11-26 ENCOUNTER — Inpatient Hospital Stay: Payer: BC Managed Care – PPO | Attending: Hematology | Admitting: Hematology

## 2019-11-26 ENCOUNTER — Inpatient Hospital Stay: Payer: BC Managed Care – PPO

## 2020-07-23 ENCOUNTER — Other Ambulatory Visit: Payer: Self-pay | Admitting: Internal Medicine

## 2022-07-24 ENCOUNTER — Encounter (HOSPITAL_COMMUNITY): Payer: Self-pay

## 2022-07-24 ENCOUNTER — Emergency Department (HOSPITAL_COMMUNITY)
Admission: EM | Admit: 2022-07-24 | Discharge: 2022-07-24 | Disposition: A | Discharge status: Home / Self Care | Payer: Managed Care, Other (non HMO) | Attending: Emergency Medicine | Admitting: Emergency Medicine

## 2022-07-24 ENCOUNTER — Other Ambulatory Visit: Payer: Self-pay

## 2022-07-24 DIAGNOSIS — Y92481 Parking lot as the place of occurrence of the external cause: Secondary | ICD-10-CM | POA: Insufficient documentation

## 2022-07-24 DIAGNOSIS — S161XXA Strain of muscle, fascia and tendon at neck level, initial encounter: Secondary | ICD-10-CM | POA: Diagnosis not present

## 2022-07-24 DIAGNOSIS — S199XXA Unspecified injury of neck, initial encounter: Secondary | ICD-10-CM | POA: Diagnosis present

## 2022-07-24 HISTORY — DX: Essential (primary) hypertension: I10

## 2022-07-24 MED ORDER — NAPROXEN 375 MG PO TABS: 375.0000 mg | ORAL_TABLET | Freq: Two times a day (BID) | ORAL | 0 refills | Status: AC

## 2022-07-24 MED ORDER — CYCLOBENZAPRINE HCL 10 MG PO TABS: 5.0000 mg | ORAL_TABLET | Freq: Two times a day (BID) | ORAL | 0 refills | Status: DC | PRN
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
## 2022-07-24 NOTE — Discharge Instructions (Addendum)

## 2022-10-23 ENCOUNTER — Emergency Department (HOSPITAL_COMMUNITY): Payer: BC Managed Care – PPO

## 2022-10-23 ENCOUNTER — Other Ambulatory Visit: Payer: Self-pay

## 2022-10-23 ENCOUNTER — Encounter (HOSPITAL_COMMUNITY): Payer: Self-pay | Admitting: Emergency Medicine

## 2022-10-23 ENCOUNTER — Emergency Department (HOSPITAL_COMMUNITY)
Admission: EM | Admit: 2022-10-23 | Discharge: 2022-10-24 | Disposition: A | Discharge status: Home / Self Care | Payer: BC Managed Care – PPO | Attending: Emergency Medicine | Admitting: Emergency Medicine

## 2022-10-23 DIAGNOSIS — S3992XA Unspecified injury of lower back, initial encounter: Secondary | ICD-10-CM | POA: Diagnosis present

## 2022-10-23 DIAGNOSIS — Y92410 Unspecified street and highway as the place of occurrence of the external cause: Secondary | ICD-10-CM | POA: Insufficient documentation

## 2022-10-23 DIAGNOSIS — N9489 Other specified conditions associated with female genital organs and menstrual cycle: Secondary | ICD-10-CM | POA: Insufficient documentation

## 2022-10-23 DIAGNOSIS — S39012A Strain of muscle, fascia and tendon of lower back, initial encounter: Secondary | ICD-10-CM | POA: Diagnosis not present

## 2022-10-23 DIAGNOSIS — S60221A Contusion of right hand, initial encounter: Secondary | ICD-10-CM | POA: Insufficient documentation

## 2022-10-23 LAB — I-STAT BETA HCG BLOOD, ED (MC, WL, AP ONLY): I-stat hCG, quantitative: <5 m[IU]/mL (ref <5)
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
## 2022-10-24 MED ORDER — CYCLOBENZAPRINE HCL 10 MG PO TABS: 10.0000 mg | ORAL_TABLET | Freq: Two times a day (BID) | ORAL | 0 refills | Status: AC | PRN

## 2022-10-24 MED ORDER — CYCLOBENZAPRINE HCL 10 MG PO TABS
10.0000 mg | ORAL_TABLET | Freq: Once | ORAL | Status: AC
  Administered 2022-10-24: 10 mg via ORAL
  Filled 2022-10-24: qty 1
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
